# Patient Record
Sex: Male | Born: 1985 | Race: White | Hispanic: No | Marital: Married | Smoking: Former smoker
Health system: Southern US, Community
[De-identification: ages and names within clinical notes are randomized; demographics above are authoritative.]

## PROBLEM LIST (undated history)

## (undated) DIAGNOSIS — Z8619 Personal history of other infectious and parasitic diseases: Secondary | ICD-10-CM

## (undated) DIAGNOSIS — K219 Gastro-esophageal reflux disease without esophagitis: Secondary | ICD-10-CM

## (undated) HISTORY — DX: Personal history of other infectious and parasitic diseases: Z86.19

## (undated) HISTORY — DX: Gastro-esophageal reflux disease without esophagitis: K21.9

## (undated) HISTORY — PX: WISDOM TOOTH EXTRACTION: SHX21

---

## 2016-03-09 ENCOUNTER — Encounter: Payer: Self-pay | Admitting: *Deleted

## 2016-03-09 ENCOUNTER — Telehealth: Payer: Self-pay | Admitting: *Deleted

## 2016-03-09 NOTE — Telephone Encounter (Signed)
Pre-Visit Call completed with patient and chart updated.   Pre-Visit Info documented in Specialty Comments under SnapShot.    

## 2016-03-10 ENCOUNTER — Ambulatory Visit (INDEPENDENT_AMBULATORY_CARE_PROVIDER_SITE_OTHER): Payer: Self-pay | Admitting: Family Medicine

## 2016-03-10 ENCOUNTER — Encounter: Payer: Self-pay | Admitting: Family Medicine

## 2016-03-10 VITALS — BP 118/50 | HR 88 | Temp 98.7°F | Ht 75.0 in | Wt 188.2 lb

## 2016-03-10 DIAGNOSIS — N529 Male erectile dysfunction, unspecified: Secondary | ICD-10-CM

## 2016-03-10 NOTE — Patient Instructions (Signed)
Ask how much the TSH/Free T4 and Free/total testosterone costs. I would also like to get a CMP, CBC, and a1c, but the first two tests are most pertinent.

## 2016-03-10 NOTE — Progress Notes (Signed)
Pre visit review using our clinic review tool, if applicable. No additional management support is needed unless otherwise documented below in the visit note. 

## 2016-03-10 NOTE — Progress Notes (Signed)
Chief Complaint  Patient presents with  . Establish Care    pt want to discuss male issues      New Patient Visit SUBJECTIVE: HPI: Jacob Frye is an 30 y.o.male who is being seen for establishing care.  The patient has not been seen in years.   ED Pt reports difficulty achieving and maintaining an erection since he started becoming sexually active. Sometimes he is able to get stiff enough for penetration, but states it has been an issue over the past 3 relationships. He does not use recreational drugs or alcohol. He does not achieve full erections outside of sexual intercourse. He does try to masturbate, but is unable to get a full erection with this as well. His desire for sex is undiminished. He reports he is sexually active with women, and does not report any interest in men.   No Known Allergies  Past Medical History:  Diagnosis Date  . History of chicken pox    History reviewed. No pertinent surgical history. Social History   Social History  . Marital status: Single   Social History Main Topics  . Smoking status: Former Smoker    Types: Cigarettes    Start date: 03/10/2006    Quit date: 03/11/2011  . Smokeless tobacco: Never Used  . Alcohol use 1.8 oz/week    3 Cans of beer per week  . Drug use: No   History reviewed. No pertinent family history.  Takes no medications routinely.   ROS Endo: Denies weight changes  GU: As noted in HPI   OBJECTIVE: BP (!) 118/50 (BP Location: Left Arm, Patient Position: Sitting, Cuff Size: Normal)   Pulse 88   Temp 98.7 F (37.1 C) (Oral)   Ht 6\' 3"  (1.905 m)   Wt 188 lb 3.2 oz (85.4 kg)   SpO2 98%   BMI 23.52 kg/m   Constitutional: -  VS reviewed -  Well developed, well nourished, appears stated age -  No apparent distress  Psychiatric: -  Oriented to person, place, and time -  Memory intact -  Affect and mood normal -  Fluent conversation, good eye contact -  Judgment and insight age appropriate  ENMT: -  Oral  mucosa without lesions, tongue and uvula midline    Tonsils not enlarged, no erythema, no exudate, trachea midline    Pharynx moist, no lesions, no erythema  Neck: -  No gross swelling, no palpable masses -  Thyroid midline, not enlarged, mobile, no palpable masses  Cardiovascular: -  RRR, no murmurs -  No LE edema  Respiratory: -  Normal respiratory effort, no accessory muscle use, no retraction -  Breath sounds equal, no wheezes, no ronchi, no crackles  Gastrointestinal: -  Bowel sounds normal -  No tenderness, no distention, no guarding, no masses  Skin: -  No significant lesion on inspection -  Warm and dry to palpation   ASSESSMENT/PLAN: Erectile dysfunction, unspecified erectile dysfunction type - Plan: CBC, Comprehensive metabolic panel, Hemoglobin A1c, TSH, T4, free, Testosterone Total,Free,Bio, Males  Pt without insurance at this time. Will inquire about cost of labs. I wrote them down and highlighted that I want the testosterone and thyroid checked most of all.  If his labs are normal, will discuss with the patient sexual counseling vs referral to urology. I would be very hesitant to rx a PDE 5 inhibitor in someone as young as him. The patient voiced understanding and agreement to the plan.   Jilda Rocheicholas Paul [facility], DO 03/10/16  3:07 PM

## 2019-06-12 ENCOUNTER — Ambulatory Visit: Payer: Self-pay | Attending: Internal Medicine

## 2019-06-12 DIAGNOSIS — U071 COVID-19: Secondary | ICD-10-CM | POA: Insufficient documentation

## 2019-06-12 DIAGNOSIS — Z20822 Contact with and (suspected) exposure to covid-19: Secondary | ICD-10-CM

## 2019-06-14 LAB — NOVEL CORONAVIRUS, NAA: SARS-CoV-2, NAA: DETECTED — AB

## 2020-07-02 ENCOUNTER — Ambulatory Visit (INDEPENDENT_AMBULATORY_CARE_PROVIDER_SITE_OTHER): Payer: BC Managed Care – PPO

## 2020-07-02 ENCOUNTER — Ambulatory Visit (HOSPITAL_COMMUNITY)
Admission: EM | Admit: 2020-07-02 | Discharge: 2020-07-02 | Disposition: A | Discharge status: Home / Self Care | Payer: BC Managed Care – PPO | Attending: Medical Oncology | Admitting: Medical Oncology

## 2020-07-02 ENCOUNTER — Encounter (HOSPITAL_COMMUNITY): Payer: Self-pay

## 2020-07-02 ENCOUNTER — Ambulatory Visit (HOSPITAL_COMMUNITY): Payer: Self-pay

## 2020-07-02 ENCOUNTER — Other Ambulatory Visit: Payer: Self-pay

## 2020-07-02 DIAGNOSIS — R1012 Left upper quadrant pain: Secondary | ICD-10-CM | POA: Diagnosis not present

## 2020-07-02 DIAGNOSIS — R509 Fever, unspecified: Secondary | ICD-10-CM

## 2020-07-02 DIAGNOSIS — Z87891 Personal history of nicotine dependence: Secondary | ICD-10-CM | POA: Insufficient documentation

## 2020-07-02 DIAGNOSIS — Z20822 Contact with and (suspected) exposure to covid-19: Secondary | ICD-10-CM | POA: Diagnosis not present

## 2020-07-02 DIAGNOSIS — R7401 Elevation of levels of liver transaminase levels: Secondary | ICD-10-CM | POA: Diagnosis not present

## 2020-07-02 LAB — COMPREHENSIVE METABOLIC PANEL
ALT: 74 U/L — ABNORMAL HIGH (ref 0–44)
AST: 45 U/L — ABNORMAL HIGH (ref 15–41)
Albumin: 5 g/dL (ref 3.5–5.0)
Alkaline Phosphatase: 41 U/L (ref 38–126)
Anion gap: 10 (ref 5–15)
BUN: 17 mg/dL (ref 6–20)
CO2: 28 mmol/L (ref 22–32)
Calcium: 10 mg/dL (ref 8.9–10.3)
Chloride: 101 mmol/L (ref 98–111)
Creatinine, Ser: 1.25 mg/dL — ABNORMAL HIGH (ref 0.61–1.24)
GFR, Estimated: >60 mL/min (ref >60)
Glucose, Bld: 118 mg/dL — ABNORMAL HIGH (ref 70–99)
Potassium: 3.9 mmol/L (ref 3.5–5.1)
Sodium: 139 mmol/L (ref 135–145)
Total Bilirubin: 0.9 mg/dL (ref 0.3–1.2)
Total Protein: 8.2 g/dL — ABNORMAL HIGH (ref 6.5–8.1)

## 2020-07-02 LAB — CBC WITH DIFFERENTIAL/PLATELET
Abs Immature Granulocytes: 0.05 10*3/uL (ref 0.00–0.07)
Basophils Absolute: 0.1 10*3/uL (ref 0.0–0.1)
Basophils Relative: 1 %
Eosinophils Absolute: 0 10*3/uL (ref 0.0–0.5)
Eosinophils Relative: 1 %
HCT: 48.2 % (ref 39.0–52.0)
Hemoglobin: 17.8 g/dL — ABNORMAL HIGH (ref 13.0–17.0)
Immature Granulocytes: 1 %
Lymphocytes Relative: 19 %
Lymphs Abs: 1.4 10*3/uL (ref 0.7–4.0)
MCH: 34.6 pg — ABNORMAL HIGH (ref 26.0–34.0)
MCHC: 36.9 g/dL — ABNORMAL HIGH (ref 30.0–36.0)
MCV: 93.6 fL (ref 80.0–100.0)
Monocytes Absolute: 0.6 10*3/uL (ref 0.1–1.0)
Monocytes Relative: 8 %
Neutro Abs: 5.2 10*3/uL (ref 1.7–7.7)
Neutrophils Relative %: 70 %
Platelets: 278 10*3/uL (ref 150–400)
RBC: 5.15 MIL/uL (ref 4.22–5.81)
RDW: 11.3 % — ABNORMAL LOW (ref 11.5–15.5)
WBC: 7.4 10*3/uL (ref 4.0–10.5)
nRBC: 0 % (ref 0.0–0.2)

## 2020-07-02 LAB — LIPASE, BLOOD: Lipase: 35 U/L (ref 11–51)

## 2020-07-02 LAB — SARS CORONAVIRUS 2 (TAT 6-24 HRS): SARS Coronavirus 2: NEGATIVE

## 2020-07-02 NOTE — Discharge Instructions (Addendum)
Hydrate with water. Clear liquid diet. Avoid alcohol and fatty foods

## 2020-07-02 NOTE — ED Provider Notes (Signed)
[facility]    CSN: [id number] Arrival date & time: 07/02/20  1113      History   Chief Complaint Chief Complaint  Patient presents with  . Abdominal Pain    Left side     HPI Jacob Frye is a 35 y.o. male.   HPI   Abdominal Pain: Patient states that he has had abdominal pain of the left upper quadrant for the past week.  Symptoms started after heavy drinking in early celebration of his birthday.  He states that the symptoms feel like a cramp or spasm and does worsen a bit when he breathes in deeply.  Overall he states that he feels fairly well although he does have a slightly elevated temperature today.  He has not tried anything for symptoms other than ibuprofen.  He denies any nausea or vomiting. NO dysuria or penile discharge.  He has had mild constipation and mild diarrhea over the course of the last week.  No known sick contacts.   Past Medical History:  Diagnosis Date  . History of chicken pox     There are no problems to display for this patient.   History reviewed. No pertinent surgical history.     Home Medications    Prior to Admission medications   Not on File    Family History History reviewed. No pertinent family history.  Social History Social History   Tobacco Use  . Smoking status: Former Smoker    Types: Cigarettes    Start date: 03/10/2006    Quit date: 03/11/2011    Years since quitting: 9.3  . Smokeless tobacco: Never Used  Substance Use Topics  . Alcohol use: Yes    Alcohol/week: 3.0 standard drinks    Types: 3 Cans of beer per week  . Drug use: No     Allergies   Patient has no known allergies.   Review of Systems Review of Systems  See above in HPI  Physical Exam Triage Vital Signs ED Triage Vitals  Enc Vitals Group     BP 07/02/20 1142 (!) 145/90     Pulse Rate 07/02/20 1142 (!) 114     Resp 07/02/20 1142 18     Temp 07/02/20 1142 99.2 F (37.3 C)     Temp Source 07/02/20 1142 Oral     SpO2  07/02/20 1142 98 %     Weight --      Height --      Head Circumference --      Peak Flow --      Pain Score 07/02/20 1141 5     Pain Loc --      Pain Edu? --      Excl. in GC? --    No data found.  Updated Vital Signs BP (!) 145/87   Pulse (!) 104   Temp 99.2 F (37.3 C) (Oral)   Resp 18   SpO2 98%   Physical Exam Vitals and nursing note reviewed.  Constitutional:      General: He is not in acute distress.    Appearance: He is not ill-appearing or toxic-appearing.  HENT:     Head: Normocephalic.     Mouth/Throat:     Mouth: Mucous membranes are moist.  Eyes:     General: No scleral icterus.    Comments: No evidence of jaundice  Cardiovascular:     Heart sounds: Normal heart sounds. No murmur heard.   Pulmonary:     Effort: Pulmonary  effort is normal.     Breath sounds: Normal breath sounds. No stridor. No wheezing, rhonchi or rales.  Chest:     Chest wall: No tenderness.  Abdominal:     General: Abdomen is flat. Bowel sounds are normal. There is no distension.     Palpations: Abdomen is soft.     Tenderness: There is abdominal tenderness (very scant left upper quadrant tendernss to palpation). There is no right CVA tenderness, left CVA tenderness, guarding or rebound. Negative signs include Murphy's sign and McBurney's sign.     Hernia: No hernia is present.  Skin:    General: Skin is warm and dry.     Coloration: Skin is not jaundiced.  Neurological:     Mental Status: He is alert.      UC Treatments / Results  Labs (all labs ordered are listed, but only abnormal results are displayed) Labs Reviewed  CBC WITH DIFFERENTIAL/PLATELET - Abnormal; Notable for the following components:      Result Value   Hemoglobin 17.8 (*)    MCH 34.6 (*)    MCHC 36.9 (*)    RDW 11.3 (*)    All other components within normal limits  COMPREHENSIVE METABOLIC PANEL - Abnormal; Notable for the following components:   Glucose, Bld 118 (*)    Creatinine, Ser 1.25 (*)     Total Protein 8.2 (*)    AST 45 (*)    ALT 74 (*)    All other components within normal limits  SARS CORONAVIRUS 2 (TAT 6-24 HRS)  LIPASE, BLOOD    Radiology DG Chest 2 View  Result Date: 07/02/2020 CLINICAL DATA:  Low-grade fever left upper quadrant pain. EXAM: CHEST - 2 VIEW COMPARISON:  None. FINDINGS: The heart size and mediastinal contours are within normal limits. No focal consolidation. No pleural effusion. No pneumothorax. The visualized skeletal structures are unremarkable. IMPRESSION: No acute cardiopulmonary disease. Electronically Signed   By: Maudry Mayhew MD   On: 07/02/2020 12:43    Procedures Procedures (including critical care time)  Medications Ordered in UC Medications - No data to display  Initial Impression / Assessment and Plan / UC Course  I have reviewed the triage vital signs and the nursing notes.  Pertinent labs & imaging results that were available during my care of the patient were reviewed by me and considered in my medical decision making (see chart for details).     New.  Given his low-grade temperature and description of symptoms we are obtaining a chest x-ray to ensure no sign of pneumonia.  We are also completing lab work to ensure no sign of pancreatitis.  Red flag symptoms discussed.  UPDATE: Chest x ray does not show any evidence of pneumonia. Pt left prior to lab results with note to triage of " Labs look fairly reassuring overall but do have some abnormalities. Liver enzymes are slightly elevated along with his kidney stress level. SO I want him to continue liquid diet for the weekend and stay well hydrated with water. No alcohol or over the counter pain medication. I want him to see his PCP early next week. If his symptoms worsen he needs to got to the ER".    Final Clinical Impressions(s) / UC Diagnoses   Final diagnoses:  LUQ abdominal pain     Discharge Instructions     Hydrate with water. Clear liquid diet. Avoid alcohol and fatty  foods    ED Prescriptions    None  PDMP not reviewed this encounter.   Rushie Chestnut, [place] 07/02/20 1538

## 2020-07-02 NOTE — ED Triage Notes (Signed)
Pt present left side upper Quadrant  abdomen pain. Pt state the pain feel like a spasm and tingling feeling. Symptoms started a week ago.

## 2020-07-05 ENCOUNTER — Ambulatory Visit: Payer: BC Managed Care – PPO | Admitting: Family Medicine

## 2020-07-05 ENCOUNTER — Encounter: Payer: Self-pay | Admitting: Family Medicine

## 2020-07-05 ENCOUNTER — Other Ambulatory Visit: Payer: Self-pay

## 2020-07-05 VITALS — BP 130/80 | HR 99 | Temp 97.8°F | Ht 75.0 in | Wt 250.8 lb

## 2020-07-05 DIAGNOSIS — R7401 Elevation of levels of liver transaminase levels: Secondary | ICD-10-CM | POA: Diagnosis not present

## 2020-07-05 DIAGNOSIS — K219 Gastro-esophageal reflux disease without esophagitis: Secondary | ICD-10-CM

## 2020-07-05 DIAGNOSIS — E785 Hyperlipidemia, unspecified: Secondary | ICD-10-CM | POA: Insufficient documentation

## 2020-07-05 DIAGNOSIS — R1011 Right upper quadrant pain: Secondary | ICD-10-CM

## 2020-07-05 DIAGNOSIS — Z13220 Encounter for screening for lipoid disorders: Secondary | ICD-10-CM | POA: Diagnosis not present

## 2020-07-05 LAB — COMPREHENSIVE METABOLIC PANEL
ALT: 76 U/L — ABNORMAL HIGH (ref 0–53)
AST: 38 U/L — ABNORMAL HIGH (ref 0–37)
Albumin: 5.4 g/dL — ABNORMAL HIGH (ref 3.5–5.2)
Alkaline Phosphatase: 41 U/L (ref 39–117)
BUN: 15 mg/dL (ref 6–23)
CO2: 26 mEq/L (ref 19–32)
Calcium: 10.4 mg/dL (ref 8.4–10.5)
Chloride: 101 mEq/L (ref 96–112)
Creatinine, Ser: 1.1 mg/dL (ref 0.40–1.50)
GFR: 87.89 mL/min (ref >60.00)
Glucose, Bld: 86 mg/dL (ref 70–99)
Potassium: 4.4 mEq/L (ref 3.5–5.1)
Sodium: 136 mEq/L (ref 135–145)
Total Bilirubin: 0.9 mg/dL (ref 0.2–1.2)
Total Protein: 8.2 g/dL (ref 6.0–8.3)

## 2020-07-05 LAB — CBC
HCT: 49.2 % (ref 39.0–52.0)
Hemoglobin: 17.1 g/dL — ABNORMAL HIGH (ref 13.0–17.0)
MCHC: 34.7 g/dL (ref 30.0–36.0)
MCV: 97.2 fl (ref 78.0–100.0)
Platelets: 300 10*3/uL (ref 150.0–400.0)
RBC: 5.06 Mil/uL (ref 4.22–5.81)
RDW: 12.3 % (ref 11.5–15.5)
WBC: 6.1 10*3/uL (ref 4.0–10.5)

## 2020-07-05 LAB — LIPID PANEL
Cholesterol: 234 mg/dL — ABNORMAL HIGH (ref 0–200)
HDL: 33.5 mg/dL — ABNORMAL LOW (ref >39.00)
LDL Cholesterol: 167 mg/dL — ABNORMAL HIGH (ref 0–99)
NonHDL: 200.22
Total CHOL/HDL Ratio: 7
Triglycerides: 168 mg/dL — ABNORMAL HIGH (ref 0.0–149.0)
VLDL: 33.6 mg/dL (ref 0.0–40.0)

## 2020-07-05 NOTE — Progress Notes (Signed)
[facility] [address] [place] [address] Dept: [phone] Dept Fax: [phone]  New Patient Office Visit  Subjective:    Patient ID: Jacob Frye, male    DOB: January 09, 1986, 35 y.o..   MRN: [id number]   Chief Complaint  Patient presents with  . Establish Care    Concerns about abdominal pains patient seen at urgent care last week had lab work drawn. Per patient symptoms have improved, not sure what he needs to do to prevent pains from coming back.     History of Present Illness:  Patient is in today to establish care. He recently had a UC visit related to a 5-day history of abdominal pain. He feels this was more pressure sensation in the right abdomen. I reviewed the UC note. He confirms that he had some alcohol binge prior to onset, but also admits to some daily drinking as well. He feels his symptoms are improving at this point. He is eating and drinking better.  Jacob Frye has a history of GERD. He notes that in the past month, he started using OTC Zantac 1-2 times a day. He found that most days, this did control his GERD symptoms.  Past Medical History: Patient Active Problem List   Diagnosis Date Noted  . Elevated transaminase level 07/05/2020  . Gastroesophageal reflux disease without esophagitis 07/05/2020    Past Surgical History:  Procedure Laterality Date  . WISDOM TOOTH EXTRACTION      Family History  Problem Relation Age of Onset  . Diabetes Mother   . Cancer Father     No outpatient medications prior to visit.   No facility-administered medications prior to visit.    No Known Allergies    Objective:   Today's Vitals   07/05/20 1054  BP: 130/80  Pulse: 99  Temp: 97.8 F (36.6 C)  TempSrc: Temporal  SpO2: 97%  Weight: 250 lb 12.8 oz (113.8 kg)  Height: 6\' 3"  (1.905 m)   Body mass index is 31.35 kg/m.   General: Well developed, well nourished. No acute distress. Abdomen: Soft,  non-tender. No hepatosplenomegaly. No rebound or guarding. Back: Straight. No CVA tenderness bilaterally. Skin: Warm and dry. No rashes. Psych: Alert and oriented. Normal mood and affect.  Health Maintenance Due  Topic Date Due  . Hepatitis C Screening  Never done    Lab Results   Chemistry      Component Value Date/Time   NA 139 07/02/2020 1228   K 3.9 07/02/2020 1228   CL 101 07/02/2020 1228   CO2 28 07/02/2020 1228   BUN 17 07/02/2020 1228   CREATININE 1.25 (H) 07/02/2020 1228      Component Value Date/Time   CALCIUM 10.0 07/02/2020 1228   ALKPHOS 41 07/02/2020 1228   AST 45 (H) 07/02/2020 1228   ALT 74 (H) 07/02/2020 1228   BILITOT 0.9 07/02/2020 1228     Lab Results  Component Value Date   WBC 7.4 07/02/2020   HGB 17.8 (H) 07/02/2020   HCT 48.2 07/02/2020   MCV 93.6 07/02/2020   PLT 278 07/02/2020   Lipase= 35 U/L    Assessment & Plan:   1. Right upper quadrant abdominal pain Pain improving at this point. No specific cause evident in right abdomen. During his recent UC visit, his HGB and several of his other indices were slightly elevated. I suspect dehydration. Will repeat CBC and CMP.  - CBC  2. Elevated transaminase level AST:ALT pattern not  consistent with alcoholism. I will screen for HCV. I will repeat today. If persistently elevated, I will order a RUQ ultrasound.  - Comprehensive metabolic panel - HCV Ab w Reflex to Quant PCR  3. Gastroesophageal reflux disease without esophagitis Stable on OTC Zantac.  4. Screening for cholesterol level  - Lipid panel  Loyola Mast, MD

## 2020-07-06 LAB — HCV AB W REFLEX TO QUANT PCR: HCV Ab: <0.1 s/co ratio (ref 0.0–0.9)

## 2020-07-06 LAB — HCV INTERPRETATION

## 2020-07-20 ENCOUNTER — Encounter: Payer: Self-pay | Admitting: Family Medicine

## 2020-07-20 ENCOUNTER — Ambulatory Visit
Admission: RE | Admit: 2020-07-20 | Discharge: 2020-07-20 | Disposition: A | Discharge status: Home / Self Care | Payer: BC Managed Care – PPO | Source: Ambulatory Visit | Attending: Family Medicine | Admitting: Family Medicine

## 2020-07-20 DIAGNOSIS — R7401 Elevation of levels of liver transaminase levels: Secondary | ICD-10-CM

## 2020-07-20 DIAGNOSIS — K760 Fatty (change of) liver, not elsewhere classified: Secondary | ICD-10-CM | POA: Insufficient documentation

## 2020-07-27 ENCOUNTER — Other Ambulatory Visit: Payer: Self-pay

## 2020-07-27 ENCOUNTER — Ambulatory Visit: Payer: BC Managed Care – PPO | Admitting: Family Medicine

## 2020-07-27 ENCOUNTER — Encounter: Payer: Self-pay | Admitting: Family Medicine

## 2020-07-27 DIAGNOSIS — K760 Fatty (change of) liver, not elsewhere classified: Secondary | ICD-10-CM | POA: Diagnosis not present

## 2020-07-27 NOTE — Progress Notes (Signed)
[facility] [address] [place] [address] Dept: [phone] Dept Fax: [phone]  Acute Office Visit  Subjective:    Patient ID: Jacob Frye, male    DOB: 1986/04/05, 35 y.o..   MRN: [id number]  Chief Complaint  Patient presents with  . Advice Only    Patient here to discuss ultrasound results.     History of Present Illness:  Patient is in today for for review of recent ultrasound and lab results. He was seen recently with a complaint of RUQ abdominal pain and elevated transaminases. He notes that since his last visit he has lost 6 lbs of weight. He feels this has decreased some of the pressure sensation he had been experiencing over this liver area.  Past Medical History: Patient Active Problem List   Diagnosis Date Noted  . Nonalcoholic fatty liver disease 07/20/2020  . Elevated transaminase level 07/05/2020  . Gastroesophageal reflux disease without esophagitis 07/05/2020  . Hyperlipidemia 07/05/2020    Past Surgical History:  Procedure Laterality Date  . WISDOM TOOTH EXTRACTION     Family History  Problem Relation Age of Onset  . Diabetes Mother   . Cancer Father    No outpatient medications prior to visit.   No facility-administered medications prior to visit.   No Known Allergies    Objective:   Today's Vitals   07/27/20 1120  BP: 110/68  Pulse: 86  Temp: (!) 97.5 F (36.4 C)  TempSrc: Temporal  SpO2: 98%  Weight: 244 lb 3.2 oz (110.8 kg)  Height: 6\' 3"  (1.905 m)   Body mass index is 30.52 kg/m.   General: Well developed, well nourished. No acute distress. Psych: Alert and oriented. Normal mood and affect.  Health Maintenance Due  Topic Date Due  . COVID-19 Vaccine (1) Never done    Lab Results CBC    Component Value Date/Time   WBC 6.1 07/05/2020 1147   RBC 5.06 07/05/2020 1147   HGB 17.1 (H) 07/05/2020 1147   HCT 49.2 07/05/2020 1147   PLT 300.0 07/05/2020 1147    MCV 97.2 07/05/2020 1147   MCH 34.6 (H) 07/02/2020 1228   MCHC 34.7 07/05/2020 1147   RDW 12.3 07/05/2020 1147   LYMPHSABS 1.4 07/02/2020 1228   MONOABS 0.6 07/02/2020 1228   EOSABS 0.0 07/02/2020 1228   BASOSABS 0.1 07/02/2020 1228   CMP     Component Value Date/Time   NA 136 07/05/2020 1147   K 4.4 07/05/2020 1147   CL 101 07/05/2020 1147   CO2 26 07/05/2020 1147   GLUCOSE 86 07/05/2020 1147   BUN 15 07/05/2020 1147   CREATININE 1.10 07/05/2020 1147   CALCIUM 10.4 07/05/2020 1147   PROT 8.2 07/05/2020 1147   ALBUMIN 5.4 (H) 07/05/2020 1147   AST 38 (H) 07/05/2020 1147   ALT 76 (H) 07/05/2020 1147   ALKPHOS 41 07/05/2020 1147   BILITOT 0.9 07/05/2020 1147   GFRNONAA >60 07/02/2020 1228   Lipid Panel     Component Value Date/Time   CHOL 234 (H) 07/05/2020 1147   TRIG 168.0 (H) 07/05/2020 1147   HDL 33.50 (L) 07/05/2020 1147   CHOLHDL 7 07/05/2020 1147   VLDL 33.6 07/05/2020 1147   LDLCALC 167 (H) 07/05/2020 1147   RUQ Ultrasound: The echogenicity of the liver is increased. This is a nonspecific finding but is most commonly seen with fatty infiltration of the liver. There are no obvious focal liver lesions identified.    Assessment &  Plan:   1. Nonalcoholic fatty liver disease Fib-4 = 0.49, NFS = -4.439- Low risk for fibrosis.  I discussed NAFLD with Mr. Overfield and provided him with a handout from [email or web address].org website. We discussed that medication therapy is not indicated. Recommend weight loss and exercise to reduce NAFLD and/or delay progression. Recommend yearly screening of lipids, blood glucose, platelets and LFTs. Discussed avoidance of other hepatotoxic agents (alcohol, certain drugs). Consider potential vaccination for Hep A and B.  Loyola Mast, MD

## 2021-08-15 ENCOUNTER — Encounter: Payer: Self-pay | Admitting: Family Medicine

## 2021-08-15 ENCOUNTER — Other Ambulatory Visit: Payer: Self-pay

## 2021-08-15 ENCOUNTER — Ambulatory Visit (INDEPENDENT_AMBULATORY_CARE_PROVIDER_SITE_OTHER): Payer: No Typology Code available for payment source | Admitting: Family Medicine

## 2021-08-15 VITALS — BP 118/74 | HR 88 | Temp 97.6°F | Ht 75.25 in | Wt 237.8 lb

## 2021-08-15 DIAGNOSIS — E782 Mixed hyperlipidemia: Secondary | ICD-10-CM

## 2021-08-15 DIAGNOSIS — Z0000 Encounter for general adult medical examination without abnormal findings: Secondary | ICD-10-CM

## 2021-08-15 DIAGNOSIS — K760 Fatty (change of) liver, not elsewhere classified: Secondary | ICD-10-CM

## 2021-08-15 DIAGNOSIS — A630 Anogenital (venereal) warts: Secondary | ICD-10-CM

## 2021-08-15 DIAGNOSIS — R7401 Elevation of levels of liver transaminase levels: Secondary | ICD-10-CM

## 2021-08-15 DIAGNOSIS — N51 Disorders of male genital organs in diseases classified elsewhere: Secondary | ICD-10-CM

## 2021-08-15 DIAGNOSIS — Z23 Encounter for immunization: Secondary | ICD-10-CM | POA: Diagnosis not present

## 2021-08-15 LAB — COMPREHENSIVE METABOLIC PANEL
ALT: 29 U/L (ref 0–53)
AST: 18 U/L (ref 0–37)
Albumin: 5 g/dL (ref 3.5–5.2)
Alkaline Phosphatase: 70 U/L (ref 39–117)
BUN: 17 mg/dL (ref 6–23)
CO2: 29 mEq/L (ref 19–32)
Calcium: 10 mg/dL (ref 8.4–10.5)
Chloride: 102 mEq/L (ref 96–112)
Creatinine, Ser: 1.05 mg/dL (ref 0.40–1.50)
GFR: 92.21 mL/min (ref >60.00)
Glucose, Bld: 108 mg/dL — ABNORMAL HIGH (ref 70–99)
Potassium: 4.3 mEq/L (ref 3.5–5.1)
Sodium: 139 mEq/L (ref 135–145)
Total Bilirubin: 0.6 mg/dL (ref 0.2–1.2)
Total Protein: 7.4 g/dL (ref 6.0–8.3)

## 2021-08-15 LAB — LIPID PANEL
Cholesterol: 195 mg/dL (ref 0–200)
HDL: 31.6 mg/dL — ABNORMAL LOW (ref >39.00)
NonHDL: 163.67
Total CHOL/HDL Ratio: 6
Triglycerides: 257 mg/dL — ABNORMAL HIGH (ref 0.0–149.0)
VLDL: 51.4 mg/dL — ABNORMAL HIGH (ref 0.0–40.0)

## 2021-08-15 LAB — LDL CHOLESTEROL, DIRECT: Direct LDL: 97 mg/dL

## 2021-08-15 MED ORDER — PODOFILOX 0.5 % EX SOLN: CUTANEOUS | 0 refills | Status: DC

## 2021-08-15 NOTE — Progress Notes (Signed)
?[place] [facility] ?[place] ?[place] [address] ?Dept: [phone] ?Dept Fax: [phone] ? ?Annual Physical Visit ? ?Subjective:  ? ? Patient ID: Jacob Frye, male    DOB: 08-07-1985, 36 y.o..   MRN: [id number] ? ?Chief Complaint  ?Patient presents with  ? Annual Exam  ?  CPE/labs.  Fasting today.  No concerns.    ? ? ?History of Present Illness: ? ?Patient is in today for an annual physical/preventative visit. ? ?Generally in good health. Moved into a home in the past year. He will be getting married in June. ? ?Mr. Caple has a history of elevated transaminase levels due to NAFLD. He notes that he has made some significant changes in diet over the past year and hopes that this is improved. He also quit drinking beer 4 months ago. ? ?Review of Systems  ?Constitutional:  Negative for chills, fever, malaise/fatigue and weight loss.  ?HENT:  Negative for congestion, ear discharge, ear pain, sinus pain and sore throat.   ?Eyes:  Negative for pain and discharge.  ?Respiratory:  Negative for cough, shortness of breath and wheezing.   ?Cardiovascular:  Negative for chest pain, palpitations and leg swelling.  ?Gastrointestinal: Negative.   ?Genitourinary: Negative.   ?Musculoskeletal:  Positive for back pain and joint pain. Negative for myalgias.  ?     Notes some intermittent low back pain and hip pain. He states this does better when he is getting regular exercise. It flares at tiems of increased physical activity, such as yard work.  ?Skin:  Positive for rash.  ?     Notes warts on his right scrotum.  ?Endo/Heme/Allergies:  Negative for environmental allergies.  ?Psychiatric/Behavioral:  Negative for depression. The patient is not nervous/anxious.   ? ?Past Medical History: ?Patient Active Problem List  ? Diagnosis Date Noted  ? Nonalcoholic fatty liver disease 07/20/2020  ? Elevated transaminase level 07/05/2020  ? Gastroesophageal reflux disease without  esophagitis 07/05/2020  ? Hyperlipidemia 07/05/2020  ? ?Past Surgical History:  ?Procedure Laterality Date  ? WISDOM TOOTH EXTRACTION    ? ?Family History  ?Problem Relation Age of Onset  ? Diabetes Mother   ? Cancer Father   ? ?Outpatient Medications Prior to Visit  ?Medication Sig Dispense Refill  ? omeprazole (PRILOSEC OTC) 20 MG tablet Take 20 mg by mouth daily.    ? ?No facility-administered medications prior to visit.  ? ?No Known Allergies ?   ?Objective:  ? ?Today's Vitals  ? 08/15/21 1010  ?BP: 118/74  ?Pulse: 88  ?Temp: 97.6 ?F (36.4 ?C)  ?TempSrc: Temporal  ?SpO2: 97%  ?Weight: 237 lb 12.8 oz (107.9 kg)  ?Height: 6' 3.25" (1.911 m)  ? ?Body mass index is 29.53 kg/m?.  ? ?General: Well developed, well nourished. No acute distress. ?HEENT: Normocephalic, non-traumatic. PERRL, EOMI. Conjunctiva clear. External ears normal. EAC and TMs normal  ? bilaterally. Nose clear without congestion or rhinorrhea. Mucous membranes moist. Oropharynx clear. Good dentition. ?Neck: Supple. No lymphadenopathy. No thyromegaly. ?Lungs: Clear to auscultation bilaterally. No wheezing, rales or rhonchi. ?CV: RRR without murmurs or rubs. Pulses 2+ bilaterally. ?Abdomen: Soft, non-tender. Bowel sounds positive, normal pitch and frequency. No hepatosplenomegaly. No rebound or  ? guarding. ?Back: Straight. No tenderness over lower back or paraspinal muscles. ?Extremities: Full ROM. No joint swelling or tenderness. No edema noted. ?Skin: Warm and dry. There is a cluster of filiform warts on the right scrotum. ?Psych: Alert and oriented. Normal mood and affect. ? ?  Health Maintenance Due  ?Topic Date Due  ? TETANUS/TDAP  10/03/2017  ? ?Lab Results ?Last metabolic panel ?Lab Results  ?Component Value Date  ? GLUCOSE 86 07/05/2020  ? NA 136 07/05/2020  ? K 4.4 07/05/2020  ? CL 101 07/05/2020  ? CO2 26 07/05/2020  ? BUN 15 07/05/2020  ? CREATININE 1.10 07/05/2020  ? GFRNONAA >60 07/02/2020  ? CALCIUM 10.4 07/05/2020  ? PROT 8.2 07/05/2020   ? ALBUMIN 5.4 (H) 07/05/2020  ? BILITOT 0.9 07/05/2020  ? ALKPHOS 41 07/05/2020  ? AST 38 (H) 07/05/2020  ? ALT 76 (H) 07/05/2020  ? ANIONGAP 10 07/02/2020  ? ?Lab Results  ?Component Value Date  ? CHOL 234 (H) 07/05/2020  ? HDL 33.50 (L) 07/05/2020  ? LDLCALC 167 (H) 07/05/2020  ? TRIG 168.0 (H) 07/05/2020  ? CHOLHDL 7 07/05/2020  ?   ?Assessment & Plan:  ? ?1. Annual physical exam ?Generally good health. I counseled him on the benefits of regular exercise. I reviewed appropriate screenings and needed immunizations. ? ?2. Nonalcoholic fatty liver disease ?3. Elevated transaminase level ?We will reassess his liver enzymes. ? ?- Comprehensive metabolic panel ? ?4. Mixed hyperlipidemia ?Will repeat annual lipids. ? ?- Lipid panel ? ?5. Condyloma acuminatum of scrotum ?Discussed a regimen of podofilox for management of the warts. ? ?- podofilox (CONDYLOX) 0.5 % external solution; Apply topically every 12 hours in the morning and evening for 3 days, then withhold for 4 days; repeat cycle up to 4 times  Dispense: 3.5 mL; Refill: 0 ? ?6. Need for Tdap vaccination ? ?- Tdap vaccine greater than or equal to 7yo IM ? ?Return in about 1 year (around 08/16/2022) for Reassessment.  ? ?Haydee Salter, MD ?

## 2022-08-18 ENCOUNTER — Ambulatory Visit (INDEPENDENT_AMBULATORY_CARE_PROVIDER_SITE_OTHER): Payer: No Typology Code available for payment source | Admitting: Family Medicine

## 2022-08-18 ENCOUNTER — Encounter: Payer: Self-pay | Admitting: Family Medicine

## 2022-08-18 VITALS — BP 124/80 | HR 100 | Temp 98.4°F | Ht 75.5 in | Wt 255.0 lb

## 2022-08-18 DIAGNOSIS — Z0001 Encounter for general adult medical examination with abnormal findings: Secondary | ICD-10-CM | POA: Diagnosis not present

## 2022-08-18 DIAGNOSIS — E782 Mixed hyperlipidemia: Secondary | ICD-10-CM | POA: Diagnosis not present

## 2022-08-18 DIAGNOSIS — R102 Pelvic and perineal pain: Secondary | ICD-10-CM

## 2022-08-18 DIAGNOSIS — Z6831 Body mass index (BMI) 31.0-31.9, adult: Secondary | ICD-10-CM

## 2022-08-18 DIAGNOSIS — E6609 Other obesity due to excess calories: Secondary | ICD-10-CM

## 2022-08-18 DIAGNOSIS — K760 Fatty (change of) liver, not elsewhere classified: Secondary | ICD-10-CM

## 2022-08-18 DIAGNOSIS — N51 Disorders of male genital organs in diseases classified elsewhere: Secondary | ICD-10-CM

## 2022-08-18 DIAGNOSIS — Z8481 Family history of carrier of genetic disease: Secondary | ICD-10-CM

## 2022-08-18 DIAGNOSIS — A630 Anogenital (venereal) warts: Secondary | ICD-10-CM

## 2022-08-18 LAB — COMPREHENSIVE METABOLIC PANEL
ALT: 57 U/L — ABNORMAL HIGH (ref 0–53)
AST: 31 U/L (ref 0–37)
Albumin: 4.9 g/dL (ref 3.5–5.2)
Alkaline Phosphatase: 53 U/L (ref 39–117)
BUN: 15 mg/dL (ref 6–23)
CO2: 27 mEq/L (ref 19–32)
Calcium: 9.7 mg/dL (ref 8.4–10.5)
Chloride: 101 mEq/L (ref 96–112)
Creatinine, Ser: 1.11 mg/dL (ref 0.40–1.50)
GFR: 85.65 mL/min (ref >60.00)
Glucose, Bld: 101 mg/dL — ABNORMAL HIGH (ref 70–99)
Potassium: 4.2 mEq/L (ref 3.5–5.1)
Sodium: 138 mEq/L (ref 135–145)
Total Bilirubin: 0.9 mg/dL (ref 0.2–1.2)
Total Protein: 7.8 g/dL (ref 6.0–8.3)

## 2022-08-18 LAB — URINALYSIS, ROUTINE W REFLEX MICROSCOPIC
Bilirubin Urine: NEGATIVE
Hgb urine dipstick: NEGATIVE
Ketones, ur: NEGATIVE
Leukocytes,Ua: NEGATIVE
Nitrite: NEGATIVE
Specific Gravity, Urine: 1.02 (ref 1.000–1.030)
Total Protein, Urine: NEGATIVE
Urine Glucose: NEGATIVE
Urobilinogen, UA: 0.2 (ref 0.0–1.0)
pH: 7 (ref 5.0–8.0)

## 2022-08-18 LAB — LIPID PANEL
Cholesterol: 228 mg/dL — ABNORMAL HIGH (ref 0–200)
HDL: 34.2 mg/dL — ABNORMAL LOW (ref >39.00)
NonHDL: 193.99
Total CHOL/HDL Ratio: 7
Triglycerides: 216 mg/dL — ABNORMAL HIGH (ref 0.0–149.0)
VLDL: 43.2 mg/dL — ABNORMAL HIGH (ref 0.0–40.0)

## 2022-08-18 LAB — CBC WITH DIFFERENTIAL/PLATELET
Basophils Absolute: 0.1 10*3/uL (ref 0.0–0.1)
Basophils Relative: 1.2 % (ref 0.0–3.0)
Eosinophils Absolute: 0 10*3/uL (ref 0.0–0.7)
Eosinophils Relative: 0.3 % (ref 0.0–5.0)
HCT: 48.4 % (ref 39.0–52.0)
Hemoglobin: 16.9 g/dL (ref 13.0–17.0)
Lymphocytes Relative: 19.1 % (ref 12.0–46.0)
Lymphs Abs: 1 10*3/uL (ref 0.7–4.0)
MCHC: 34.9 g/dL (ref 30.0–36.0)
MCV: 94.6 fl (ref 78.0–100.0)
Monocytes Absolute: 0.5 10*3/uL (ref 0.1–1.0)
Monocytes Relative: 9.2 % (ref 3.0–12.0)
Neutro Abs: 3.7 10*3/uL (ref 1.4–7.7)
Neutrophils Relative %: 70.2 % (ref 43.0–77.0)
Platelets: 261 10*3/uL (ref 150.0–400.0)
RBC: 5.11 Mil/uL (ref 4.22–5.81)
RDW: 12.7 % (ref 11.5–15.5)
WBC: 5.3 10*3/uL (ref 4.0–10.5)

## 2022-08-18 LAB — LDL CHOLESTEROL, DIRECT: Direct LDL: 131 mg/dL

## 2022-08-18 MED ORDER — NAPROXEN 500 MG PO TABS: 500.0000 mg | ORAL_TABLET | Freq: Two times a day (BID) | ORAL | 0 refills | Status: AC

## 2022-08-18 MED ORDER — PODOFILOX 0.5 % EX SOLN: CUTANEOUS | 0 refills | Status: AC

## 2022-08-18 NOTE — Assessment & Plan Note (Signed)
I will reassess lipids today. 

## 2022-08-18 NOTE — Progress Notes (Signed)
[facility] [address] [place] [address] Dept: [phone] Dept Fax: [phone]  Annual Physical Visit  Subjective:    Patient ID: Jacob Frye, male    DOB: 1985-08-14, 37 y.o..   MRN: [id number]  Chief Complaint  Patient presents with   Annual Exam    CPE/labs.  Fasting today.         History of Present Illness:  Patient is in today for an annual physical/preventative visit.  Mr. Viscardi got married in the past year. He notes his father has been tested and found to have a CHEK2 gene mutation. He wonders if he should be tested.  Review of Systems  Constitutional:  Negative for chills, diaphoresis, fever, malaise/fatigue and weight loss.       Admits to issues of weight gain over the past year. He has fallen off of his exercise routine.  HENT:  Negative for congestion, ear pain, hearing loss, sinus pain, sore throat and tinnitus.   Eyes:  Negative for blurred vision, pain, discharge and redness.  Respiratory:  Negative for cough, shortness of breath and wheezing.   Cardiovascular:  Negative for chest pain and palpitations.  Gastrointestinal:  Positive for abdominal pain and heartburn. Negative for constipation, diarrhea, nausea and vomiting.       Manages acid reflux with omeprazole. Has occasional discomfort in the RUQ which he associates with his NAFLD.  Genitourinary:        History of condyloma of the scrotum. He never did the course of podofilox that was prescribed last year.  Mr. Veltri notes a past history of discomfort in the perineum. He was evaluated and eventually treated with a course of antibiotics in his 51s for this. It did resolve the issue. He is now back to having a similar discomfort. He is not having any changes in urination.  Musculoskeletal:  Negative for back pain, joint pain and myalgias.  Skin:  Negative for itching and rash.  Psychiatric/Behavioral:  Negative for depression. The patient is  not nervous/anxious.    Past Medical History: Patient Active Problem List   Diagnosis Date Noted   Family history of CHEK2 gene mutation 08/18/2022   Class 1 obesity due to excess calories with body mass index (BMI) of 31.0 to 31.9 in adult 08/18/2022   Condyloma acuminatum of scrotum [id number]   Nonalcoholic fatty liver disease 07/20/2020   Elevated transaminase level 07/05/2020   Gastroesophageal reflux disease without esophagitis 07/05/2020   Hyperlipidemia 07/05/2020   Past Surgical History:  Procedure Laterality Date   WISDOM TOOTH EXTRACTION     Family History  Problem Relation Age of Onset   Diabetes Mother    Cancer Father    Outpatient Medications Prior to Visit  Medication Sig Dispense Refill   omeprazole (PRILOSEC OTC) 20 MG tablet Take 20 mg by mouth daily.     podofilox (CONDYLOX) 0.5 % external solution Apply topically every 12 hours in the morning and evening for 3 days, then withhold for 4 days; repeat cycle up to 4 times (Patient not taking: Reported on 08/18/2022) 3.5 mL 0   No facility-administered medications prior to visit.   No Known Allergies Objective:   Today's Vitals   08/18/22 0946  BP: 124/80  Pulse: 100  Temp: 98.4 F (36.9 C)  TempSrc: Temporal  SpO2: 99%  Weight: 255 lb (115.7 kg)  Height: 6' 3.5" (1.918 m)   Body mass index is 31.45 kg/m.   General: Well developed, well nourished.  No acute distress. HEENT: Normocephalic, non-traumatic. PERRL, EOMI. Conjunctiva clear. External ears normal. EAC and   TMs normal bilaterally. Nose clear without congestion or rhinorrhea. Mucous membranes moist.   Oropharynx clear. Good dentition. Neck: Supple. No lymphadenopathy. No thyromegaly. Lungs: Clear to auscultation bilaterally. No wheezing, rales or rhonchi. CV: RRR without murmurs or rubs. Pulses 2+ bilaterally. Abdomen: Soft, non-tender. Bowel sounds positive, normal pitch and frequency. No hepatosplenomegaly.   No rebound or guarding. GU:  Normal circumcised male. Testicles descended bilaterally. No testicular or perineal masses. There are   still multiple filiform wart lesions on the surface of the scrotum. Rectal. No masses. Prostate is of normal size and consistentcy. Nontender.  Extremities: Full ROM. No joint swelling or tenderness. No edema noted. Skin: Warm and dry. No rashes. Psych: Alert and oriented. Normal mood and affect.  There are no preventive care reminders to display for this patient.    Assessment & Plan:   Problem List Items Addressed This Visit       Digestive   Nonalcoholic fatty liver disease    In light of weight gain, I will reassess his LFTs and CBC today.      Relevant Orders   Comprehensive metabolic panel   CBC with Differential/Platelet     Other   Hyperlipidemia    I will reassess lipids today.      Relevant Orders   Lipid panel   Family history of CHEK2 gene mutation    CHEK2 gene mutations can be associated with risk for prostate cancer. I recommend we refer him to a geneticist to discuss possible testing and implications on cancer screening.      Relevant Orders   Ambulatory referral to Genetics   Class 1 obesity due to excess calories with body mass index (BMI) of 31.0 to 31.9 in adult    Discussed 18 lb. weight gain over the past year. I strongly encourage routine exercise and a calorie-restricted diet as a foundation for weight loss.      Condyloma acuminatum of scrotum    I will renew the prescription for Condylox.      Relevant Medications   podofilox (CONDYLOX) 0.5 % external solution   Perineal pain in male    Etiology is unclear. I will check a  urine for GC and chlamydia. I recommend he try a trial of Naproxen and warm soaks. If not improving int he next 2 weeks and if no sign of infection, would consider a urology referral.      Relevant Medications   naproxen (NAPROSYN) 500 MG tablet   Other Relevant Orders   Urinalysis, Routine w reflex microscopic    Chlamydia/GC NAA, Confirmation   Other Visit Diagnoses     Encounter for general adult medical examination with abnormal findings    -  Primary   Overall health is good. UTD on screenings and immunizations.       Return in about 1 year (around 08/18/2023) for Annual preventative care.   Haydee Salter, MD

## 2022-08-18 NOTE — Assessment & Plan Note (Signed)
I will renew the prescription for Condylox.

## 2022-08-18 NOTE — Assessment & Plan Note (Signed)
In light of weight gain, I will reassess his LFTs and CBC today.

## 2022-08-18 NOTE — Assessment & Plan Note (Signed)
CHEK2 gene mutations can be associated with risk for prostate cancer. I recommend we refer him to a geneticist to discuss possible testing and implications on cancer screening.

## 2022-08-18 NOTE — Assessment & Plan Note (Signed)
Discussed 18 lb. weight gain over the past year. I strongly encourage routine exercise and a calorie-restricted diet as a foundation for weight loss.

## 2022-08-18 NOTE — Assessment & Plan Note (Signed)
Etiology is unclear. I will check a  urine for GC and chlamydia. I recommend he try a trial of Naproxen and warm soaks. If not improving int he next 2 weeks and if no sign of infection, would consider a urology referral.

## 2022-08-23 LAB — CHLAMYDIA/GC NAA, CONFIRMATION
Chlamydia trachomatis, NAA: NEGATIVE
Neisseria gonorrhoeae, NAA: NEGATIVE

## 2022-08-25 ENCOUNTER — Telehealth: Payer: Self-pay | Admitting: Family Medicine

## 2022-08-25 ENCOUNTER — Encounter: Payer: Self-pay | Admitting: Family Medicine

## 2022-08-25 DIAGNOSIS — R102 Pelvic and perineal pain: Secondary | ICD-10-CM

## 2022-08-25 MED ORDER — CIPROFLOXACIN HCL 500 MG PO TABS: 500.0000 mg | ORAL_TABLET | Freq: Two times a day (BID) | ORAL | 0 refills | Status: AC

## 2022-08-25 NOTE — Telephone Encounter (Signed)
Caller Name: Abisai Call back phone #: [phone]  Reason for Call: Pt called to say his symptoms with his testicles are not improving. The Naproxen is not helping. He is requesting an antibiotic and would appreciate it asap so he doesn't have to be uncomfortable all weekend.

## 2022-09-04 NOTE — Telephone Encounter (Signed)
Caller Name: Jesten Call back phone #: [phone]  [place] Has patient seen PCP for this complaint? Yes.   - - IF NO, is insurance requiring patient see PCP for this issue before PCP can refer them? Yes.   Referral for which specialty: urology Preferred provider/office: [facility] Reason for referral: pt has 3 days of medication left and issue has not resolved. Requesting referral.

## 2022-09-15 ENCOUNTER — Telehealth: Payer: Self-pay | Admitting: Licensed Clinical Social Worker

## 2022-09-15 NOTE — Telephone Encounter (Signed)
Returned patient's call to cancel may appointments. Stated will call back when ready to r/s.

## 2022-10-09 ENCOUNTER — Encounter: Payer: No Typology Code available for payment source | Admitting: Licensed Clinical Social Worker

## 2022-10-09 ENCOUNTER — Other Ambulatory Visit: Payer: No Typology Code available for payment source

## 2022-11-12 IMAGING — DX DG CHEST 2V
3 series · 3 of 3 positions shown · non-contrast
Comparison: None.

CLINICAL DATA: Low-grade fever left upper quadrant pain.

EXAM:
CHEST - 2 VIEW

[chest pa (1 of 2)]
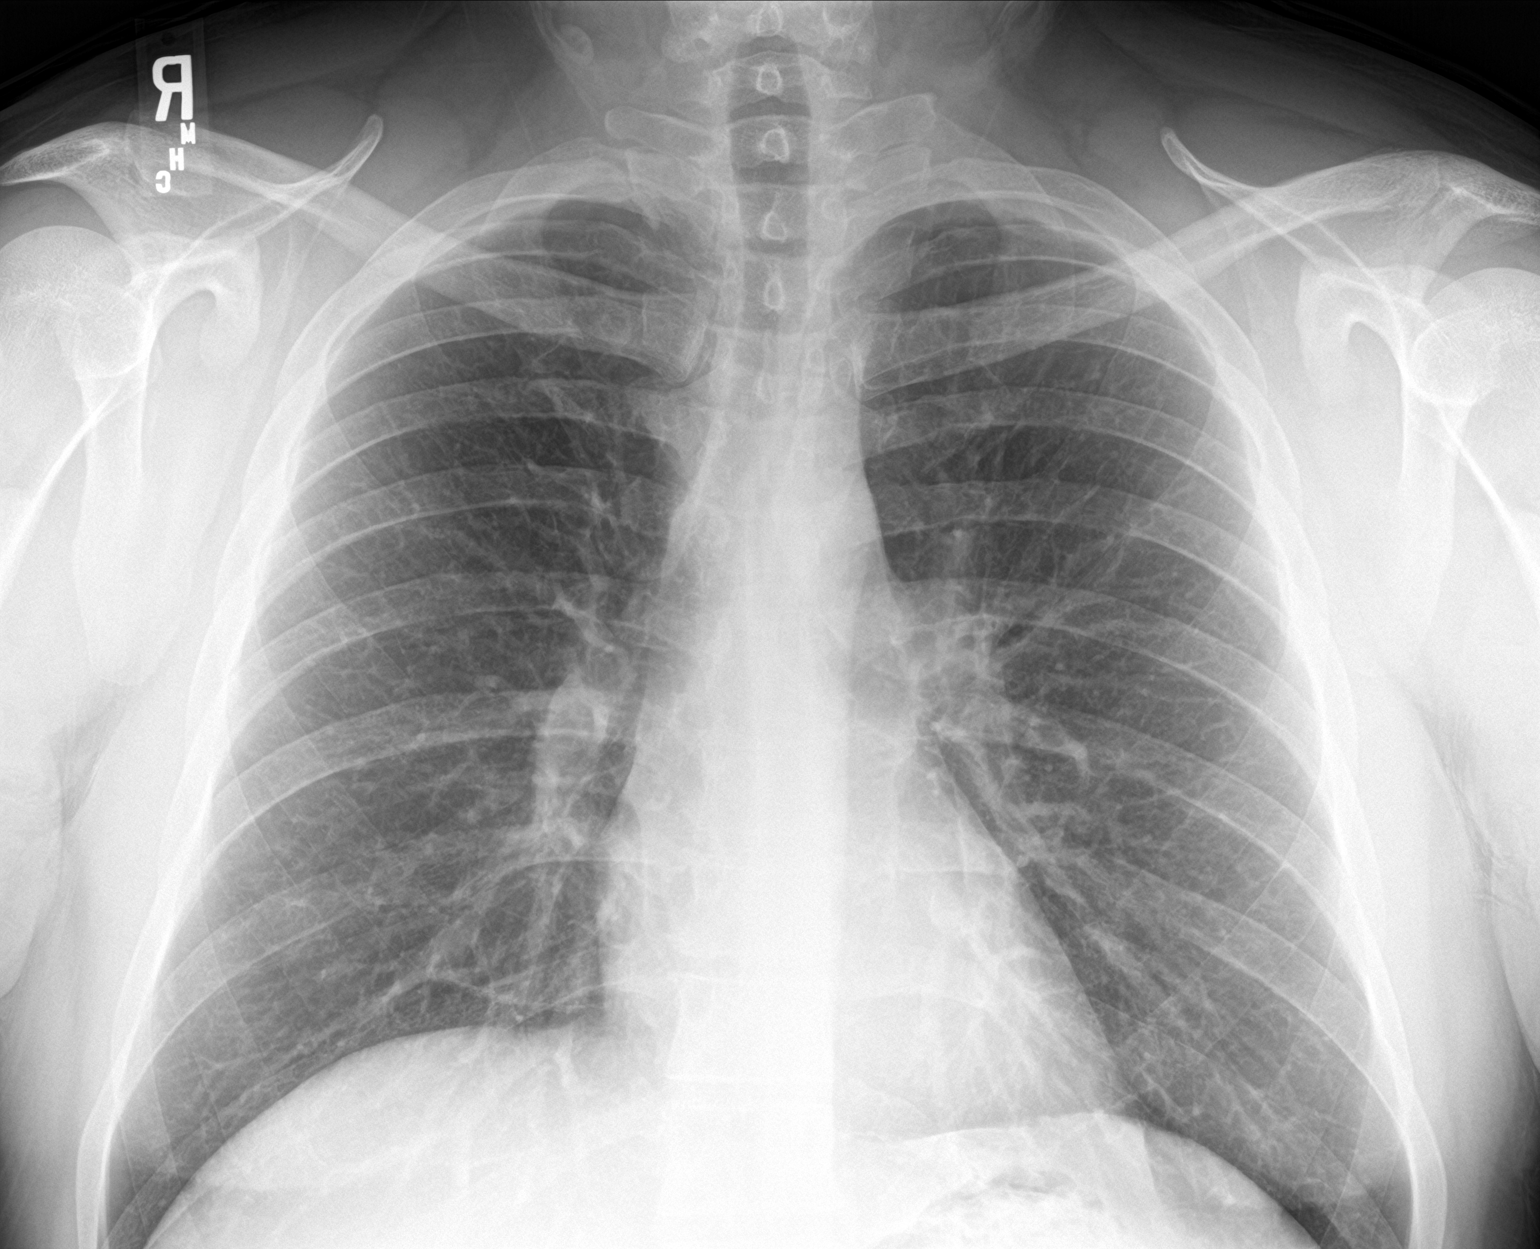

[chest lat]
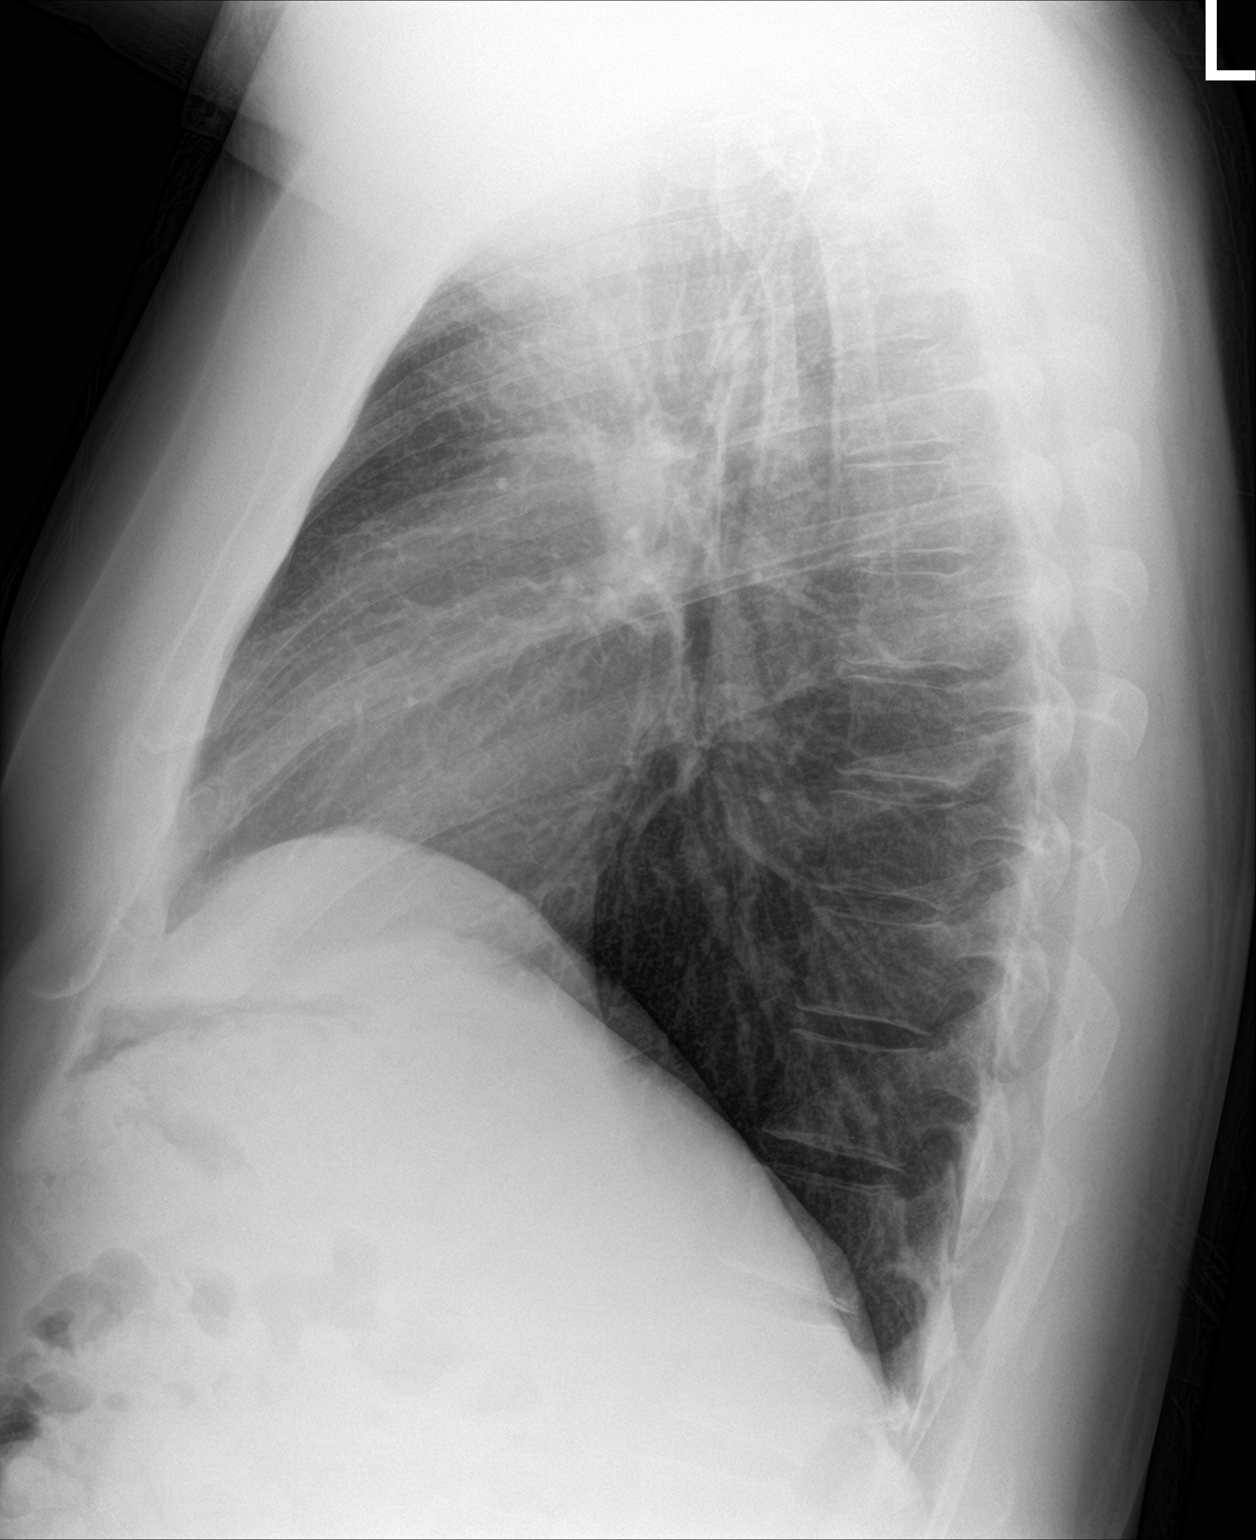

[chest pa (2 of 2)]
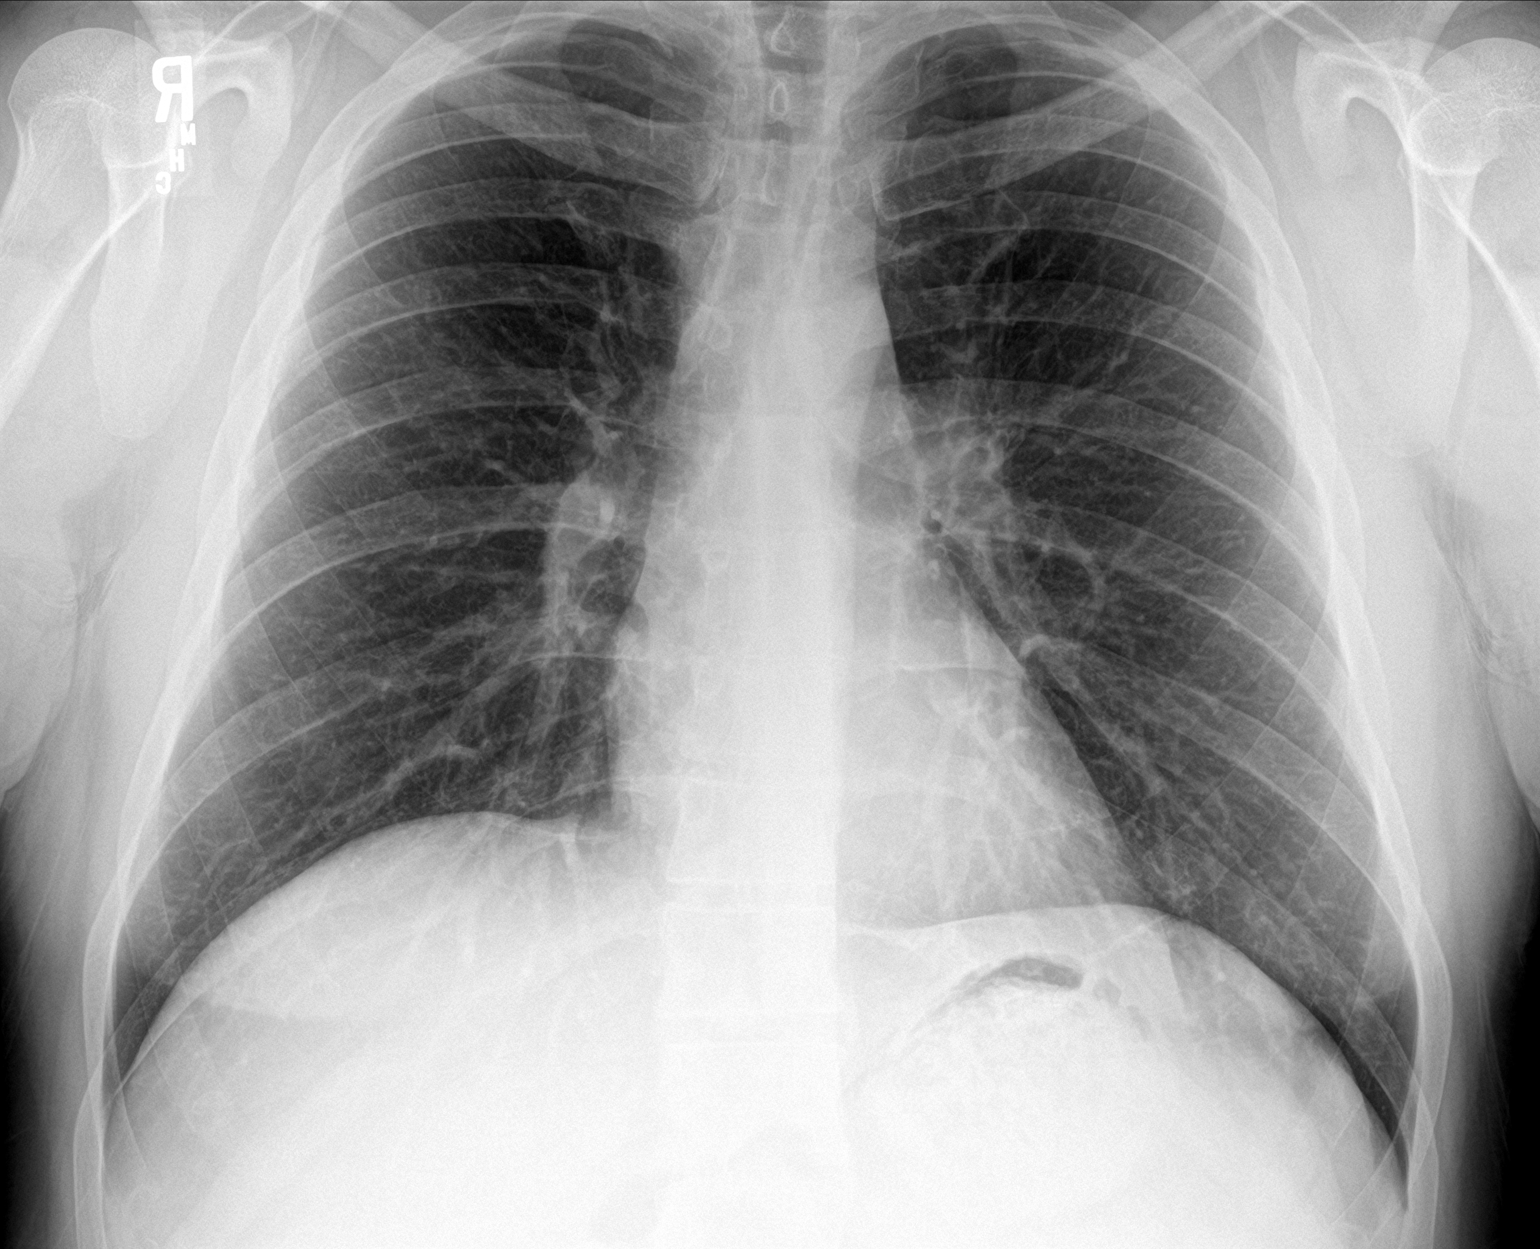

[3 of 3 positions shown; findings below may reference images not displayed]

FINDINGS: The heart size and mediastinal contours are within normal limits. No
focal consolidation. No pleural effusion. No pneumothorax. The
visualized skeletal structures are unremarkable.
IMPRESSION: No acute cardiopulmonary disease.

## 2022-11-30 IMAGING — US US ABDOMEN LIMITED RUQ/ASCITES
1 series · 14 of 25 positions shown · non-contrast
Comparison: None.

CLINICAL DATA: Elevated LFTs

EXAM:
ULTRASOUND ABDOMEN LIMITED RIGHT UPPER QUADRANT

[Series 1: us abdomen limited ruq/ascites · 0.22mm/px · 14 of 50 slices shown]
[im 1/50]
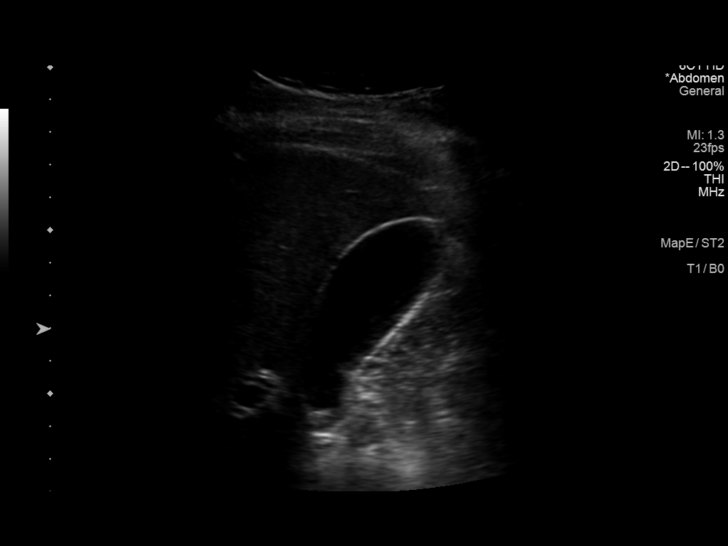
[im 5/50]
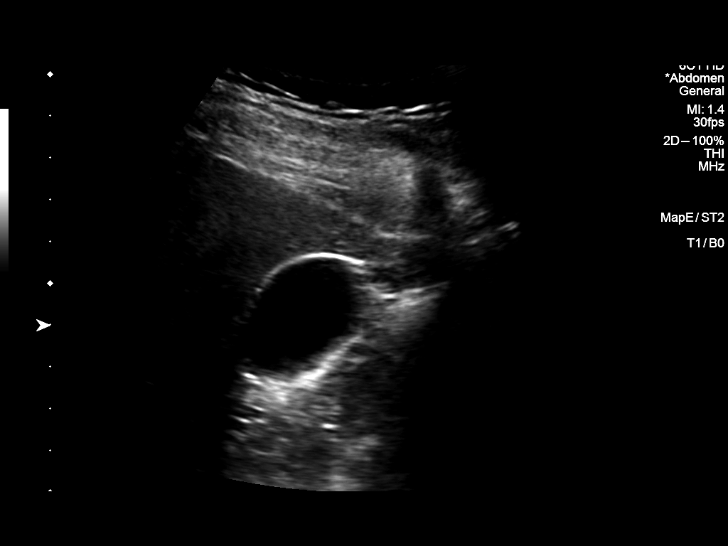
[im 9/50]
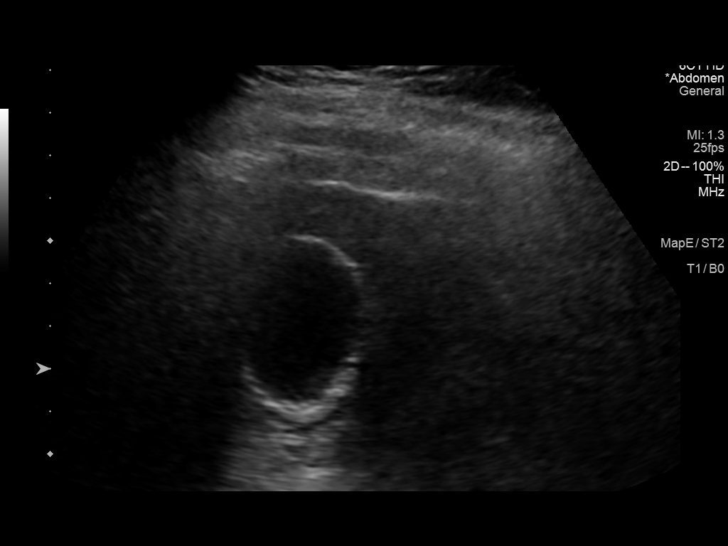
[im 13/50]
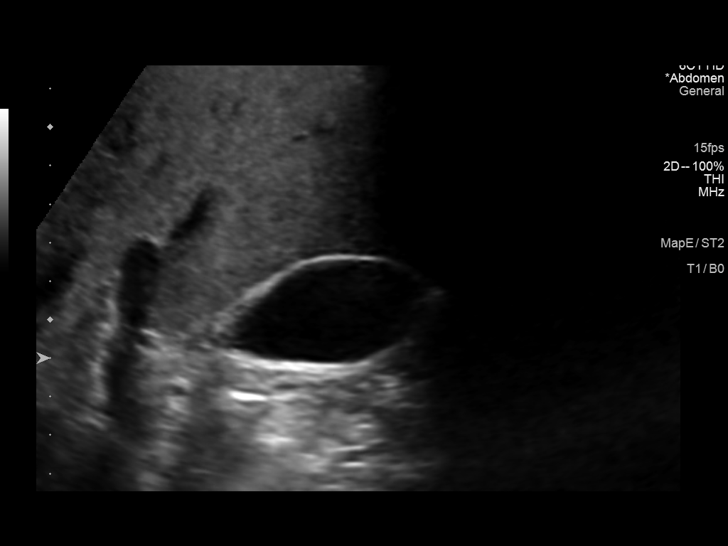
[im 17/50]
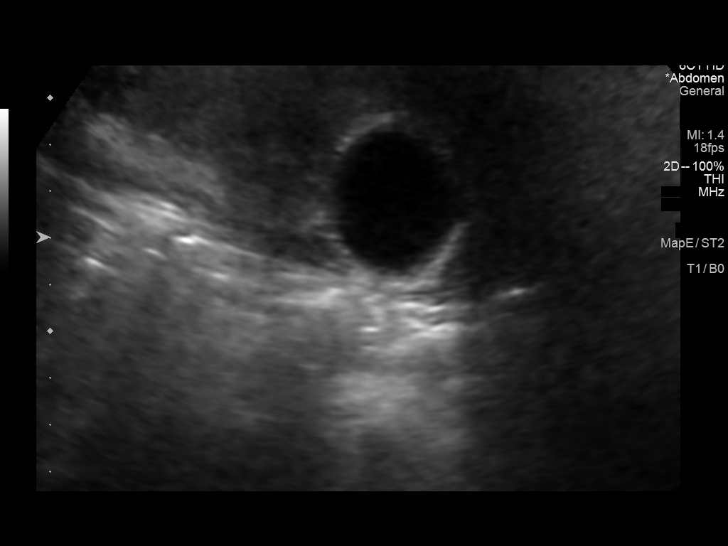
[im 19/50]
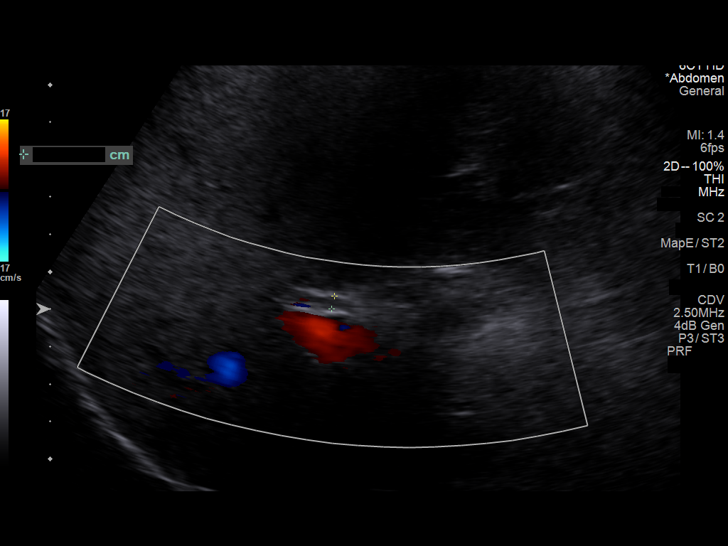
[im 23/50]
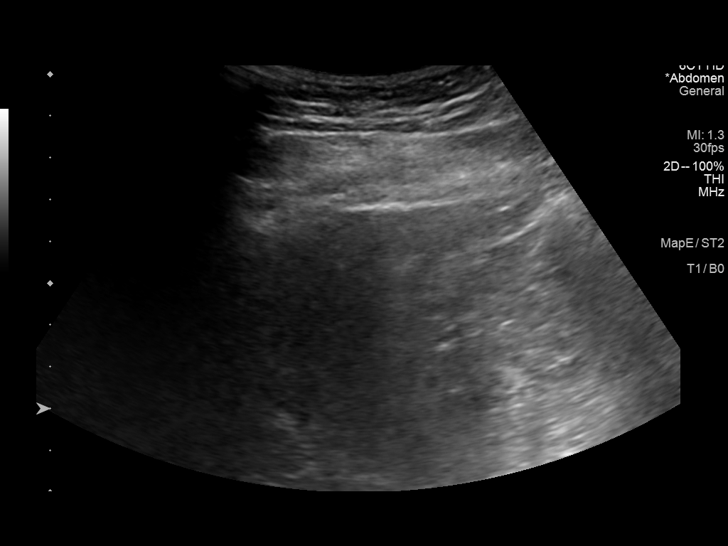
[im 27/50]
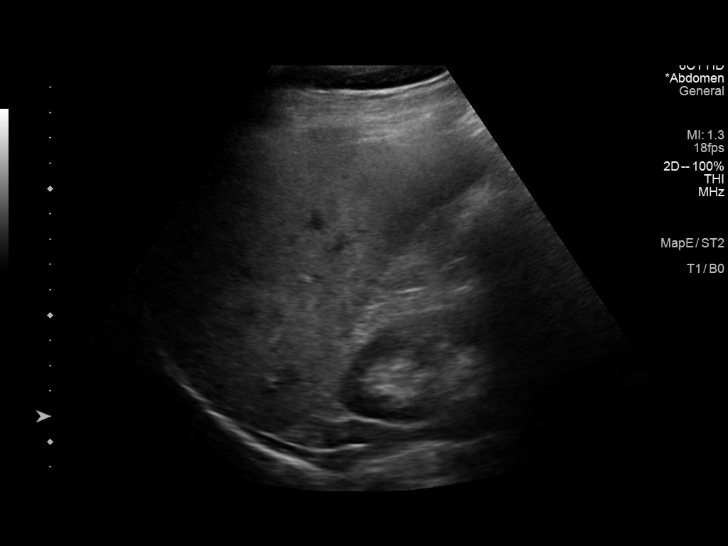
[im 31/50]
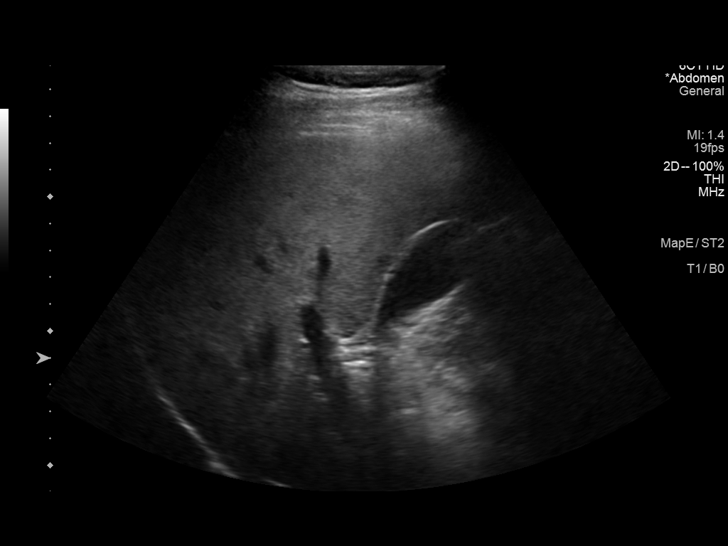
[im 33/50]
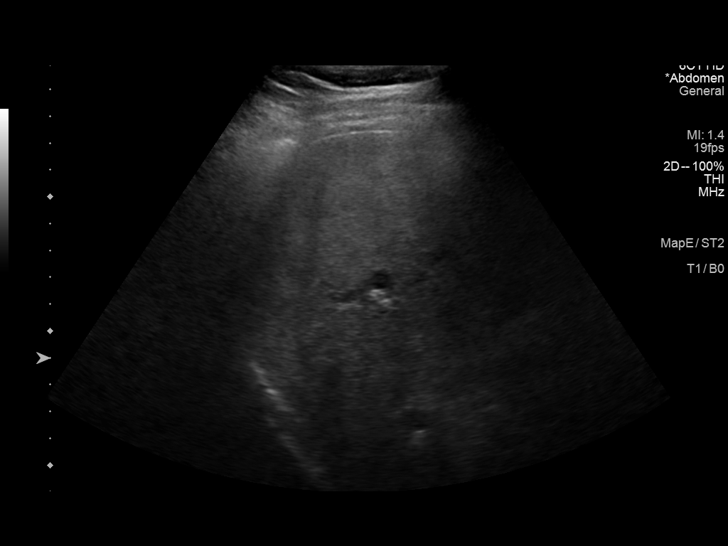
[im 37/50]
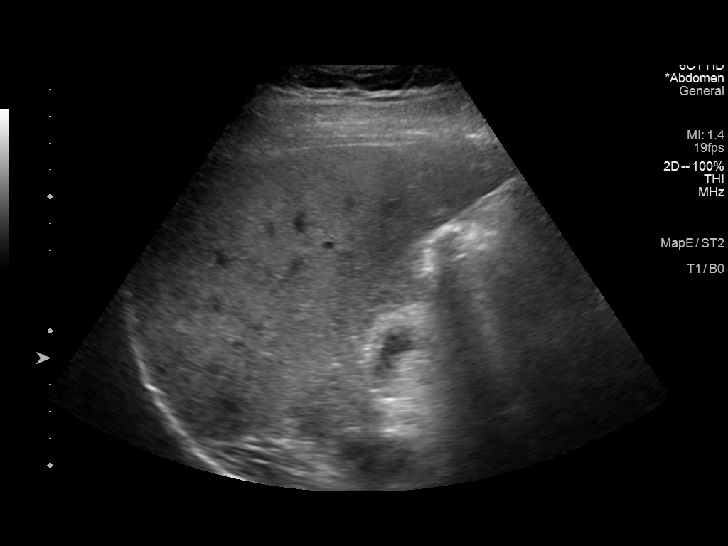
[im 41/50]
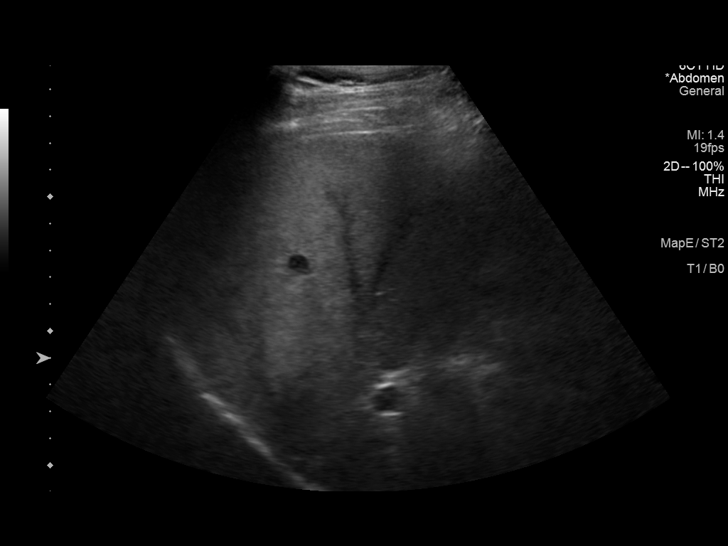
[im 45/50]
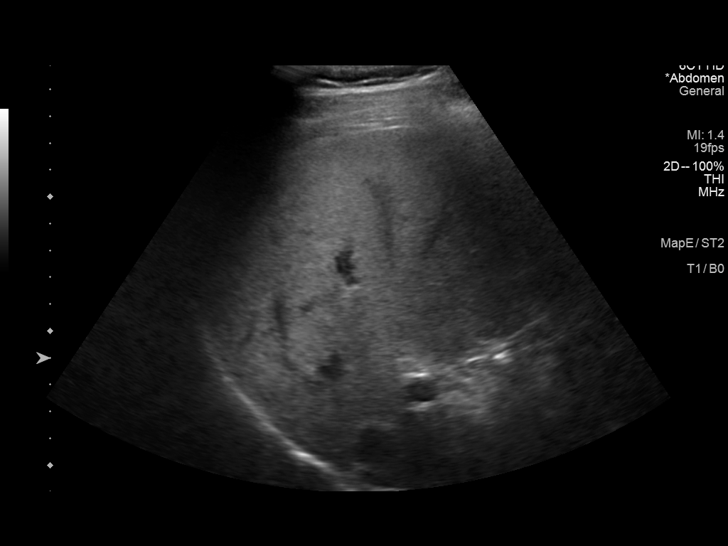
[im 50/50]
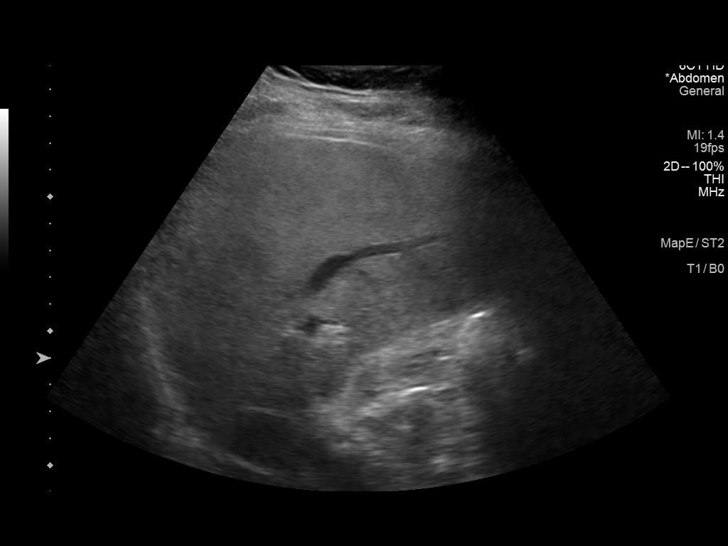

[14 of 25 positions shown; findings below may reference images not displayed]

FINDINGS: Gallbladder:

No gallstones or wall thickening visualized. No sonographic Murphy
sign noted by sonographer.

Common bile duct:

Diameter: 4 mm

Liver:

No focal lesion identified. Diffusely increased parenchymal
echogenicity. Portal vein is patent on color Doppler imaging with
normal direction of blood flow towards the liver.

Other: None.
IMPRESSION: The echogenicity of the liver is increased. This is a nonspecific
finding but is most commonly seen with fatty infiltration of the
liver. There are no obvious focal liver lesions identified.
